# Patient Record
Sex: Male | Born: 1970 | Race: White | Hispanic: No | Marital: Single | Smoking: Current every day smoker
Health system: Southern US, Community
[De-identification: ages and names within clinical notes are randomized; demographics above are authoritative.]

## PROBLEM LIST (undated history)

## (undated) DIAGNOSIS — F319 Bipolar disorder, unspecified: Secondary | ICD-10-CM

## (undated) DIAGNOSIS — F32A Depression, unspecified: Secondary | ICD-10-CM

## (undated) DIAGNOSIS — F4310 Post-traumatic stress disorder, unspecified: Secondary | ICD-10-CM

## (undated) DIAGNOSIS — M549 Dorsalgia, unspecified: Secondary | ICD-10-CM

## (undated) DIAGNOSIS — F329 Major depressive disorder, single episode, unspecified: Secondary | ICD-10-CM

---

## 2000-06-01 ENCOUNTER — Emergency Department (HOSPITAL_COMMUNITY): Admission: EM | Admit: 2000-06-01 | Discharge: 2000-06-01 | Payer: Self-pay | Admitting: *Deleted

## 2000-12-12 ENCOUNTER — Emergency Department (HOSPITAL_COMMUNITY): Admission: EM | Admit: 2000-12-12 | Discharge: 2000-12-12 | Payer: Self-pay

## 2002-08-19 ENCOUNTER — Emergency Department (HOSPITAL_COMMUNITY): Admission: EM | Admit: 2002-08-19 | Discharge: 2002-08-19 | Payer: Self-pay | Admitting: Emergency Medicine

## 2003-12-26 ENCOUNTER — Encounter: Admission: RE | Admit: 2003-12-26 | Discharge: 2003-12-26 | Payer: Self-pay | Admitting: Family Medicine

## 2004-12-06 ENCOUNTER — Emergency Department (HOSPITAL_COMMUNITY): Admission: EM | Admit: 2004-12-06 | Discharge: 2004-12-06 | Payer: Self-pay | Admitting: Emergency Medicine

## 2005-02-24 ENCOUNTER — Emergency Department (HOSPITAL_COMMUNITY): Admission: EM | Admit: 2005-02-24 | Discharge: 2005-02-24 | Payer: Self-pay | Admitting: Emergency Medicine

## 2005-06-18 ENCOUNTER — Emergency Department (HOSPITAL_COMMUNITY): Admission: EM | Admit: 2005-06-18 | Discharge: 2005-06-18 | Payer: Self-pay | Admitting: Emergency Medicine

## 2005-07-21 ENCOUNTER — Ambulatory Visit: Payer: Self-pay | Admitting: Family Medicine

## 2006-03-24 ENCOUNTER — Emergency Department (HOSPITAL_COMMUNITY): Admission: EM | Admit: 2006-03-24 | Discharge: 2006-03-24 | Payer: Self-pay

## 2011-06-30 ENCOUNTER — Ambulatory Visit (INDEPENDENT_AMBULATORY_CARE_PROVIDER_SITE_OTHER): Payer: Self-pay

## 2011-06-30 ENCOUNTER — Inpatient Hospital Stay (INDEPENDENT_AMBULATORY_CARE_PROVIDER_SITE_OTHER)
Admission: RE | Admit: 2011-06-30 | Discharge: 2011-06-30 | Disposition: A | Discharge status: Home / Self Care | Payer: Self-pay | Source: Ambulatory Visit | Attending: Emergency Medicine | Admitting: Emergency Medicine

## 2011-06-30 DIAGNOSIS — L723 Sebaceous cyst: Secondary | ICD-10-CM

## 2011-06-30 DIAGNOSIS — IMO0002 Reserved for concepts with insufficient information to code with codable children: Secondary | ICD-10-CM

## 2011-06-30 DIAGNOSIS — M549 Dorsalgia, unspecified: Secondary | ICD-10-CM

## 2011-07-04 ENCOUNTER — Inpatient Hospital Stay (INDEPENDENT_AMBULATORY_CARE_PROVIDER_SITE_OTHER)
Admission: RE | Admit: 2011-07-04 | Discharge: 2011-07-04 | Disposition: A | Discharge status: Home / Self Care | Payer: Self-pay | Source: Ambulatory Visit | Attending: Family Medicine | Admitting: Family Medicine

## 2011-07-04 DIAGNOSIS — M549 Dorsalgia, unspecified: Secondary | ICD-10-CM

## 2011-08-01 ENCOUNTER — Emergency Department (HOSPITAL_COMMUNITY)
Admission: EM | Admit: 2011-08-01 | Discharge: 2011-08-01 | Disposition: A | Discharge status: Home / Self Care | Payer: Self-pay | Attending: Emergency Medicine | Admitting: Emergency Medicine

## 2011-08-01 DIAGNOSIS — R209 Unspecified disturbances of skin sensation: Secondary | ICD-10-CM | POA: Insufficient documentation

## 2011-08-01 DIAGNOSIS — F341 Dysthymic disorder: Secondary | ICD-10-CM | POA: Insufficient documentation

## 2011-08-01 DIAGNOSIS — Z79899 Other long term (current) drug therapy: Secondary | ICD-10-CM | POA: Insufficient documentation

## 2011-08-01 LAB — POCT I-STAT, CHEM 8
Calcium, Ion: 1.14 mmol/L (ref 1.12–1.32)
Chloride: 109 mEq/L (ref 96–112)
HCT: 44 % (ref 39.0–52.0)
Potassium: 3.7 mEq/L (ref 3.5–5.1)

## 2011-08-22 ENCOUNTER — Other Ambulatory Visit (HOSPITAL_COMMUNITY): Payer: Self-pay | Admitting: Family Medicine

## 2011-08-22 DIAGNOSIS — G8929 Other chronic pain: Secondary | ICD-10-CM

## 2011-08-28 ENCOUNTER — Other Ambulatory Visit (HOSPITAL_COMMUNITY): Payer: Self-pay

## 2011-09-04 ENCOUNTER — Ambulatory Visit (HOSPITAL_COMMUNITY)
Admission: RE | Admit: 2011-09-04 | Discharge: 2011-09-04 | Disposition: A | Discharge status: Home / Self Care | Payer: Self-pay | Source: Ambulatory Visit | Attending: Family Medicine | Admitting: Family Medicine

## 2011-09-04 DIAGNOSIS — M549 Dorsalgia, unspecified: Secondary | ICD-10-CM

## 2011-09-04 DIAGNOSIS — M48061 Spinal stenosis, lumbar region without neurogenic claudication: Secondary | ICD-10-CM | POA: Insufficient documentation

## 2011-09-04 DIAGNOSIS — M519 Unspecified thoracic, thoracolumbar and lumbosacral intervertebral disc disorder: Secondary | ICD-10-CM | POA: Insufficient documentation

## 2011-09-04 DIAGNOSIS — M25519 Pain in unspecified shoulder: Secondary | ICD-10-CM | POA: Insufficient documentation

## 2011-09-04 DIAGNOSIS — M545 Low back pain, unspecified: Secondary | ICD-10-CM | POA: Insufficient documentation

## 2011-09-04 DIAGNOSIS — M5126 Other intervertebral disc displacement, lumbar region: Secondary | ICD-10-CM | POA: Insufficient documentation

## 2016-05-02 ENCOUNTER — Encounter (HOSPITAL_COMMUNITY): Payer: Self-pay | Admitting: Emergency Medicine

## 2016-05-02 ENCOUNTER — Emergency Department (HOSPITAL_COMMUNITY)
Admission: EM | Admit: 2016-05-02 | Discharge: 2016-05-02 | Disposition: A | Discharge status: Home / Self Care | Payer: Self-pay | Attending: Emergency Medicine | Admitting: Emergency Medicine

## 2016-05-02 DIAGNOSIS — R451 Restlessness and agitation: Secondary | ICD-10-CM | POA: Insufficient documentation

## 2016-05-02 DIAGNOSIS — S0181XA Laceration without foreign body of other part of head, initial encounter: Secondary | ICD-10-CM | POA: Insufficient documentation

## 2016-05-02 DIAGNOSIS — Y9389 Activity, other specified: Secondary | ICD-10-CM | POA: Insufficient documentation

## 2016-05-02 DIAGNOSIS — Y998 Other external cause status: Secondary | ICD-10-CM | POA: Insufficient documentation

## 2016-05-02 DIAGNOSIS — Y9289 Other specified places as the place of occurrence of the external cause: Secondary | ICD-10-CM | POA: Insufficient documentation

## 2016-05-02 DIAGNOSIS — F10129 Alcohol abuse with intoxication, unspecified: Secondary | ICD-10-CM | POA: Insufficient documentation

## 2016-05-02 HISTORY — DX: Depression, unspecified: F32.A

## 2016-05-02 HISTORY — DX: Post-traumatic stress disorder, unspecified: F43.10

## 2016-05-02 HISTORY — DX: Bipolar disorder, unspecified: F31.9

## 2016-05-02 HISTORY — DX: Major depressive disorder, single episode, unspecified: F32.9
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
## 2016-05-02 NOTE — ED Notes (Signed)
Pt left with mother and step-father per [employer] Officer Trisha Mangleiaz.

## 2016-05-02 NOTE — ED Notes (Signed)
Pt mother and step father here to see pt. Pt is not in the waiting room. This tech went outside and called pt but no answer.

## 2017-10-06 LAB — GLUCOSE, POCT (MANUAL RESULT ENTRY): POC GLUCOSE: 86 mg/dL (ref 70–99)

## 2017-11-05 ENCOUNTER — Emergency Department (HOSPITAL_COMMUNITY): Payer: Self-pay

## 2017-11-05 ENCOUNTER — Encounter (HOSPITAL_COMMUNITY): Payer: Self-pay | Admitting: *Deleted

## 2017-11-05 ENCOUNTER — Emergency Department (HOSPITAL_COMMUNITY)
Admission: EM | Admit: 2017-11-05 | Discharge: 2017-11-05 | Disposition: A | Discharge status: Home / Self Care | Payer: Self-pay | Attending: Emergency Medicine | Admitting: Emergency Medicine

## 2017-11-05 ENCOUNTER — Other Ambulatory Visit: Payer: Self-pay

## 2017-11-05 DIAGNOSIS — M545 Low back pain: Secondary | ICD-10-CM | POA: Insufficient documentation

## 2017-11-05 DIAGNOSIS — G8929 Other chronic pain: Secondary | ICD-10-CM

## 2017-11-05 DIAGNOSIS — F17200 Nicotine dependence, unspecified, uncomplicated: Secondary | ICD-10-CM | POA: Insufficient documentation

## 2017-11-05 DIAGNOSIS — R0789 Other chest pain: Secondary | ICD-10-CM | POA: Insufficient documentation

## 2017-11-05 HISTORY — DX: Dorsalgia, unspecified: M54.9

## 2017-11-05 LAB — CBC
HEMATOCRIT: 40 % (ref 39.0–52.0)
Hemoglobin: 13.5 g/dL (ref 13.0–17.0)
MCH: 30.9 pg (ref 26.0–34.0)
MCHC: 33.8 g/dL (ref 30.0–36.0)
MCV: 91.5 fL (ref 78.0–100.0)
Platelets: 198 10*3/uL (ref 150–400)
RBC: 4.37 MIL/uL (ref 4.22–5.81)
RDW: 13.7 % (ref 11.5–15.5)
WBC: 13.2 10*3/uL — ABNORMAL HIGH (ref 4.0–10.5)

## 2017-11-05 LAB — COMPREHENSIVE METABOLIC PANEL
ALT: 25 U/L (ref 17–63)
AST: 30 U/L (ref 15–41)
Albumin: 4.6 g/dL (ref 3.5–5.0)
Alkaline Phosphatase: 53 U/L (ref 38–126)
Anion gap: 7 (ref 5–15)
BUN: 13 mg/dL (ref 6–20)
CO2: 26 mmol/L (ref 22–32)
CREATININE: 0.81 mg/dL (ref 0.61–1.24)
Calcium: 8.9 mg/dL (ref 8.9–10.3)
Chloride: 105 mmol/L (ref 101–111)
GFR calc non Af Amer: >60 mL/min (ref >60)
Glucose, Bld: 100 mg/dL — ABNORMAL HIGH (ref 65–99)
POTASSIUM: 3.3 mmol/L — AB (ref 3.5–5.1)
Sodium: 138 mmol/L (ref 135–145)
Total Bilirubin: 0.5 mg/dL (ref 0.3–1.2)
Total Protein: 8 g/dL (ref 6.5–8.1)

## 2017-11-05 LAB — URINALYSIS, ROUTINE W REFLEX MICROSCOPIC
BILIRUBIN URINE: NEGATIVE
Glucose, UA: NEGATIVE mg/dL
Hgb urine dipstick: NEGATIVE
KETONES UR: 20 mg/dL — AB
LEUKOCYTES UA: NEGATIVE
NITRITE: NEGATIVE
Protein, ur: NEGATIVE mg/dL
Specific Gravity, Urine: 1.017 (ref 1.005–1.030)
pH: 6 (ref 5.0–8.0)

## 2017-11-05 LAB — RAPID URINE DRUG SCREEN, HOSP PERFORMED
Amphetamines: NOT DETECTED
BARBITURATES: NOT DETECTED
BENZODIAZEPINES: NOT DETECTED
COCAINE: NOT DETECTED
Opiates: NOT DETECTED
TETRAHYDROCANNABINOL: POSITIVE — AB

## 2017-11-05 LAB — I-STAT TROPONIN, ED: Troponin i, poc: 0 ng/mL (ref 0.00–0.08)

## 2017-11-05 LAB — D-DIMER, QUANTITATIVE: D-Dimer, Quant: 0.82 ug/mL-FEU — ABNORMAL HIGH (ref 0.00–0.50)

## 2017-11-05 LAB — LIPASE, BLOOD: LIPASE: 22 U/L (ref 11–51)

## 2017-11-05 MED ORDER — NAPROXEN 500 MG PO TABS: 500.0000 mg | ORAL_TABLET | Freq: Two times a day (BID) | ORAL | 0 refills | Status: AC

## 2017-11-05 MED ORDER — CYCLOBENZAPRINE HCL 10 MG PO TABS: 10.0000 mg | ORAL_TABLET | Freq: Two times a day (BID) | ORAL | 0 refills | Status: AC | PRN

## 2017-11-05 MED ORDER — IOPAMIDOL (ISOVUE-370) INJECTION 76%
INTRAVENOUS | Status: AC
  Filled 2017-11-05: qty 100

## 2017-11-05 MED ORDER — IOPAMIDOL (ISOVUE-370) INJECTION 76%
100.0000 mL | Freq: Once | INTRAVENOUS | Status: AC | PRN
  Administered 2017-11-05: 100 mL via INTRAVENOUS

## 2017-11-05 NOTE — ED Notes (Signed)
Bed: [id number] Expected date: 11/05/17 Expected time:  Means of arrival:  Comments: Use for EKG

## 2017-11-05 NOTE — ED Notes (Signed)
Patient transported to X-ray 

## 2017-11-05 NOTE — ED Notes (Signed)
Patient transported to CT 
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
## 2017-11-05 NOTE — ED Triage Notes (Signed)
EMS states he [facility] a [facility], smoked some "Weed" this am then developed N/V with abd pain. IV #18 R AC

## 2017-11-05 NOTE — Discharge Instructions (Signed)
Please read the attached information regarding your condition. Take Flexeril and naproxen as needed for pain and spasms. Follow-up with your primary care provider for further evaluation. Return to ED for worsening chest pain, trouble breathing, coughing up blood, leg swelling or wheezing.

## 2019-05-17 IMAGING — CT CT ANGIO CHEST
2 of 6 series · 18 of 36 positions shown · IV contrast (ISOVUE 370)
Comparison: None.

CLINICAL DATA: 45-year-old male with nausea, vomiting and abdominal
pain after smoking marijuana. Positive D-dimer.

EXAM:
CT ANGIOGRAPHY CHEST WITH CONTRAST
TECHNIQUE: Multidetector CT imaging of the chest was performed using the
standard protocol during bolus administration of intravenous
contrast. Multiplanar CT image reconstructions and MIPs were
obtained to evaluate the vascular anatomy.
CONTRAST:  100mL [id number] IOPAMIDOL (NSD1ST-Z66) INJECTION 76%

[Series 6: thins for pacs · axial · 0.74mm/px · z∈[+1280,+1524]mm · 17 of 272 slices shown]
[im 14/272  lung]
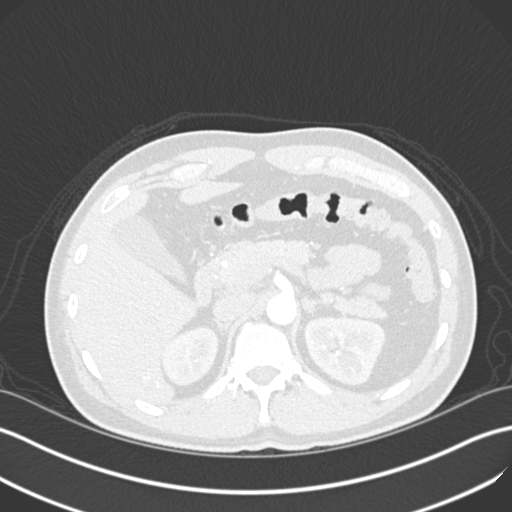
[im 28/272  mediastinal]
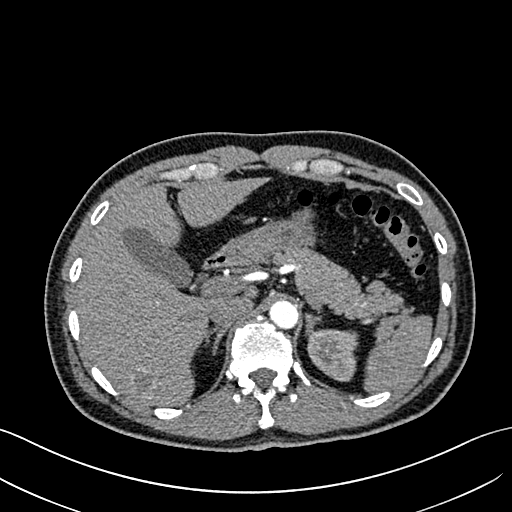
[im 41/272  lung]
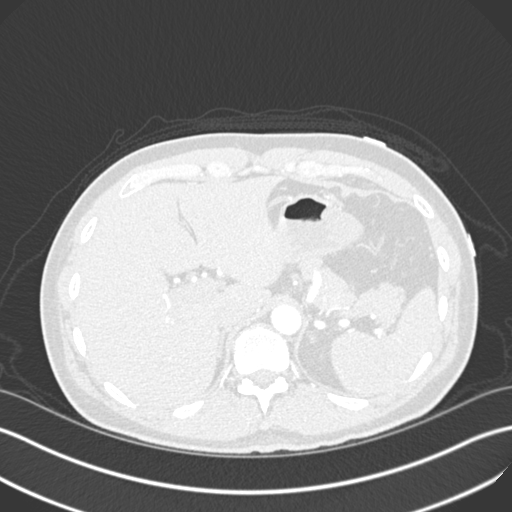
[im 55/272  mediastinal]
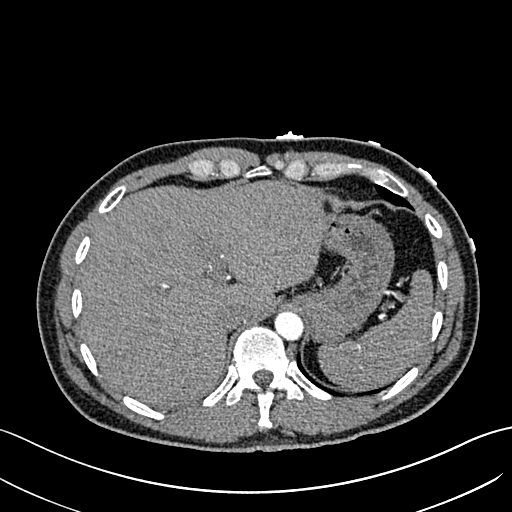
[im 82/272  lung]
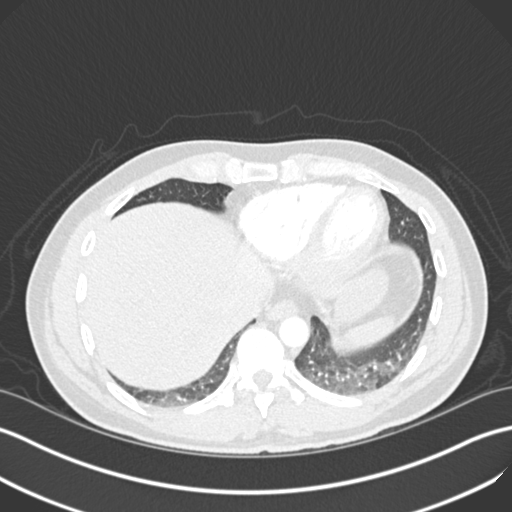
[im 95/272  mediastinal]
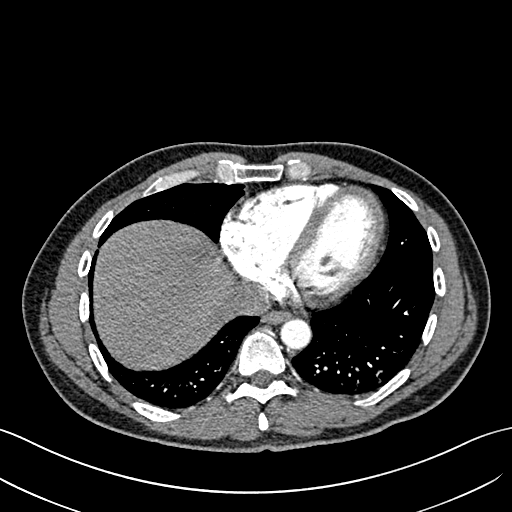
[im 109/272  lung]
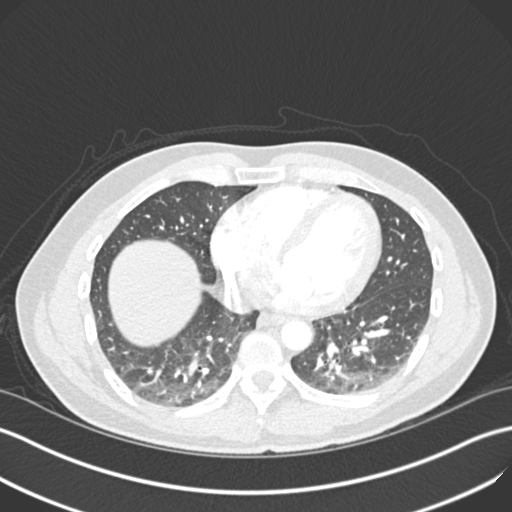
[im 122/272  mediastinal]
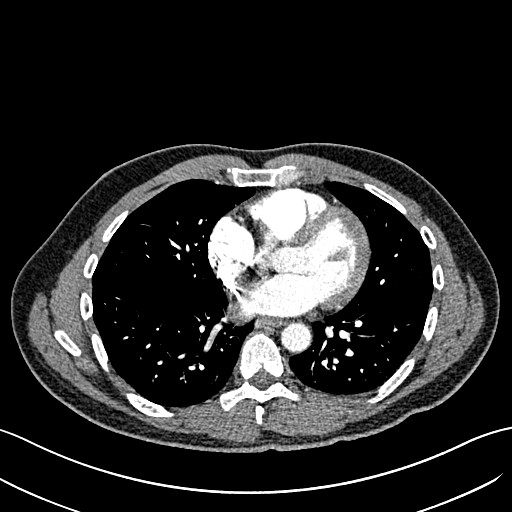
[im 136/272  lung]
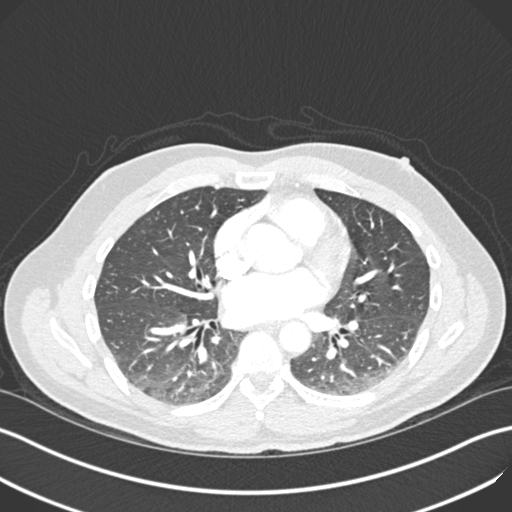
[im 150/272  mediastinal]
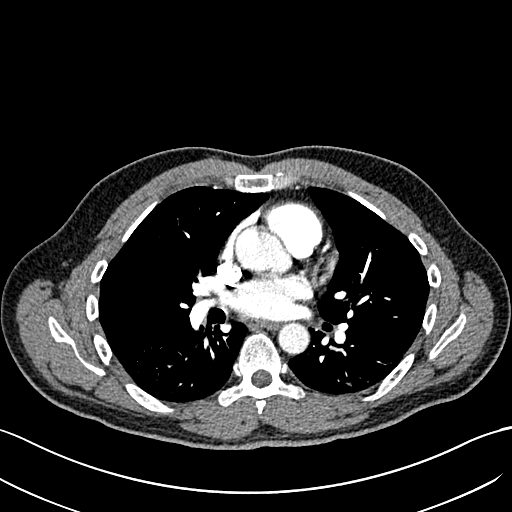
[im 163/272  lung]
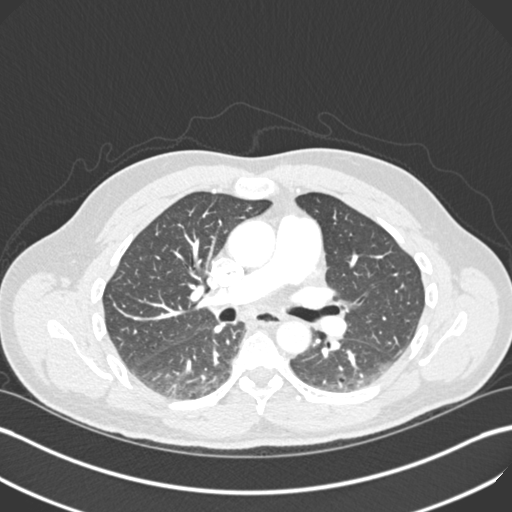
[im 177/272  mediastinal]
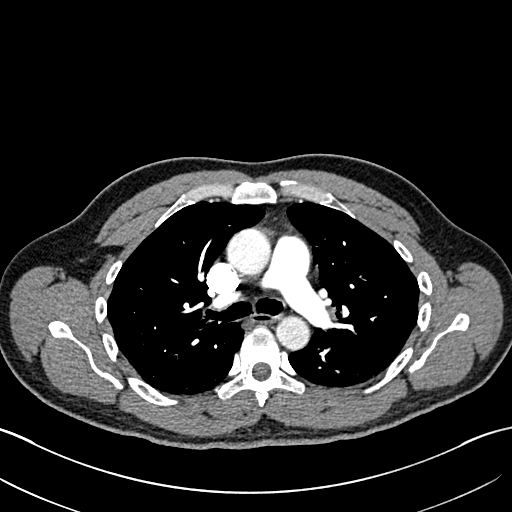
[im 190/272  lung]
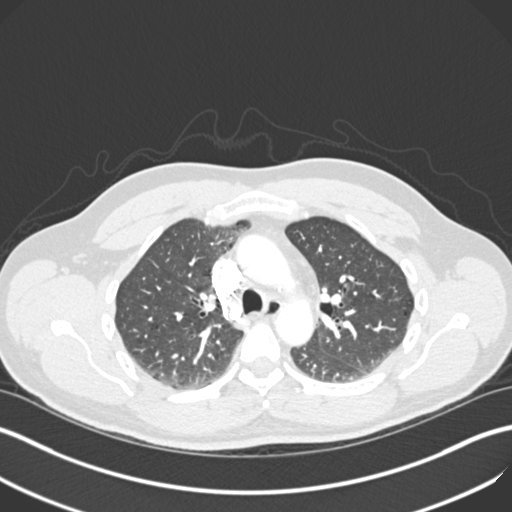
[im 217/272  mediastinal]
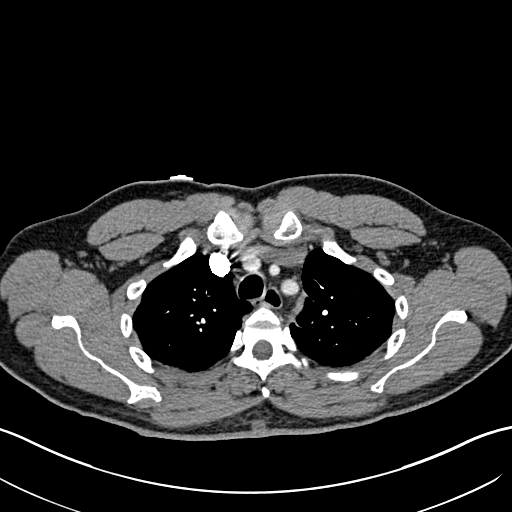
[im 231/272  lung]
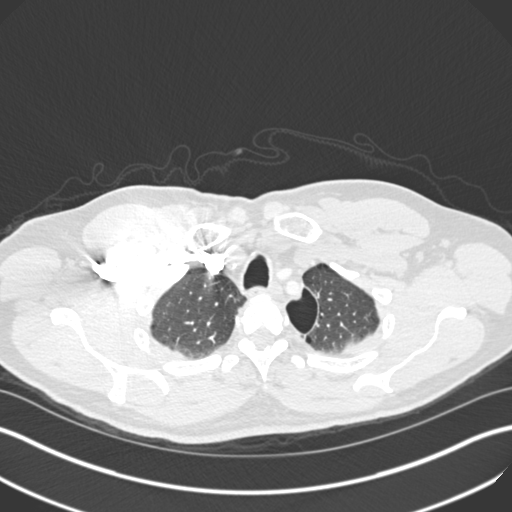
[im 244/272  mediastinal]
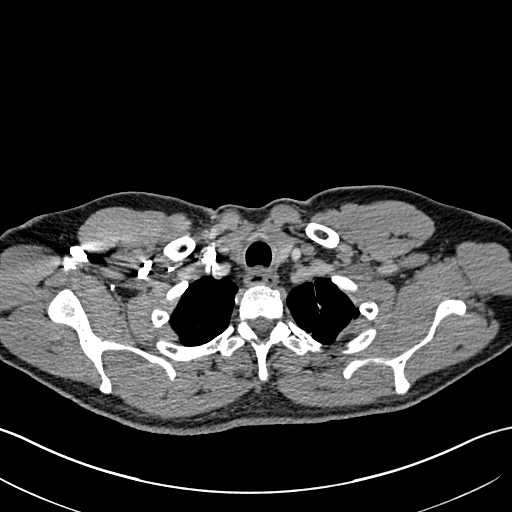
[im 258/272  lung]
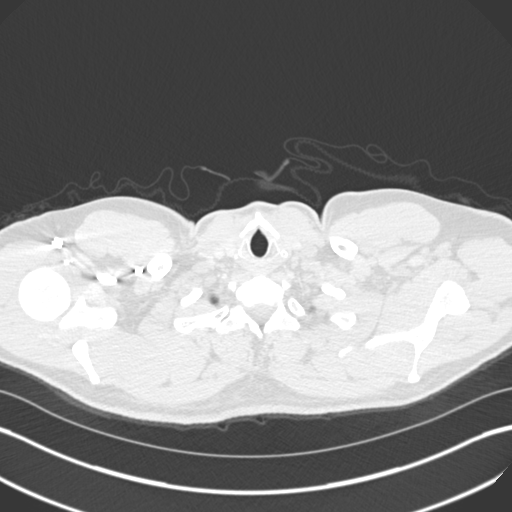

[Series 8: coronal mpr · coronal · 0.60mm/px · 1 of 108 slices shown]
[im 54/108  mediastinal]
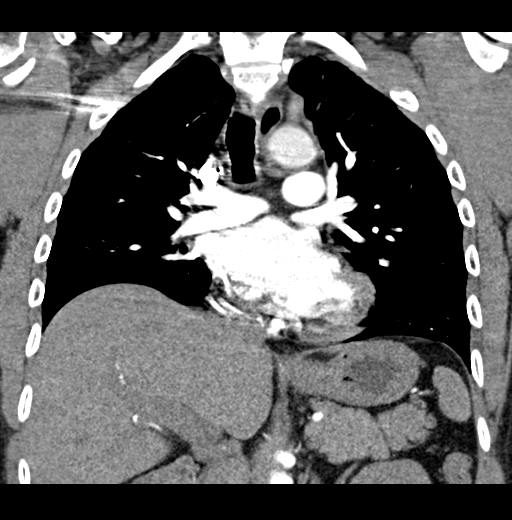

[18 of 36 positions shown; findings below may reference images not displayed]

FINDINGS: Cardiovascular: Satisfactory opacification of the pulmonary arteries
to the segmental level. No evidence of pulmonary embolism. Normal
heart size. No pericardial effusion.

Mediastinum/Nodes: Unremarkable CT appearance of the thyroid gland.
No suspicious mediastinal or hilar adenopathy. No soft tissue
mediastinal mass. The thoracic esophagus is unremarkable.

Lungs/Pleura: Solitary 3.7 cm bulla versus pulmonic cyst in the
medial aspect of the left lung apex. Otherwise, very mild paraseptal
emphysema. Dependent atelectasis noted incidentally in both lower
lobes. The lungs are otherwise clear. No pleural effusion,
pneumothorax, pneumonia or pulmonary nodule.

Upper Abdomen: [DATE] x 2.3 x 1.9 cm low-attenuation lesion in hepatic
segment 6 which demonstrates peripheral nodular incomplete
enhancement. This is incompletely evaluated without delayed imaging.
Otherwise, the visualized upper abdomen is unremarkable.

Musculoskeletal: No acute fracture or aggressive appearing lytic or
blastic osseous lesion. 1.2 cm sebaceous cyst in the subcutaneous
fat of the midline back.

Review of the MIP images confirms the above findings.
IMPRESSION: 1. Negative for acute pulmonary embolus, pneumonia or other acute
cardiopulmonary process.
2. Solitary pulmonic cyst versus bulla in the medial left lung apex.
3. Incompletely evaluated 2.7 cm low-attenuation lesion in hepatic
segment 6 which appears to demonstrate peripheral nodular incomplete
enhancement. This is highly likely a benign hemangioma. Further
evaluation with a non emergent liver protocol CT or MR scan of the
abdomen or pelvis could confirm.
Emphysema ([id number]).
